# Patient Record
Sex: Female | Born: 1950 | Race: White | Hispanic: No | Marital: Married | Smoking: Never smoker
Health system: Southern US, Community
[De-identification: ages and names within clinical notes are randomized; demographics above are authoritative.]

## PROBLEM LIST (undated history)

## (undated) DIAGNOSIS — M109 Gout, unspecified: Secondary | ICD-10-CM

## (undated) DIAGNOSIS — T7840XA Allergy, unspecified, initial encounter: Secondary | ICD-10-CM

## (undated) DIAGNOSIS — E119 Type 2 diabetes mellitus without complications: Secondary | ICD-10-CM

## (undated) HISTORY — PX: GALLBLADDER SURGERY: SHX652

## (undated) HISTORY — DX: Gout, unspecified: M10.9

## (undated) HISTORY — DX: Allergy, unspecified, initial encounter: T78.40XA

## (undated) HISTORY — DX: Type 2 diabetes mellitus without complications: E11.9

---

## 2003-08-09 ENCOUNTER — Other Ambulatory Visit: Admission: RE | Admit: 2003-08-09 | Discharge: 2003-08-09 | Payer: Self-pay | Admitting: Internal Medicine

## 2004-10-02 ENCOUNTER — Ambulatory Visit: Payer: Self-pay | Admitting: Internal Medicine

## 2004-10-02 ENCOUNTER — Other Ambulatory Visit: Admission: RE | Admit: 2004-10-02 | Discharge: 2004-10-02 | Payer: Self-pay | Admitting: Family Medicine

## 2007-12-10 ENCOUNTER — Ambulatory Visit: Payer: Self-pay | Admitting: Family Medicine

## 2007-12-13 ENCOUNTER — Telehealth (INDEPENDENT_AMBULATORY_CARE_PROVIDER_SITE_OTHER): Payer: Self-pay | Admitting: *Deleted

## 2007-12-21 ENCOUNTER — Encounter (INDEPENDENT_AMBULATORY_CARE_PROVIDER_SITE_OTHER): Payer: Self-pay | Admitting: Internal Medicine

## 2009-02-01 ENCOUNTER — Ambulatory Visit: Payer: Self-pay | Admitting: Family Medicine

## 2013-11-21 ENCOUNTER — Ambulatory Visit: Payer: Self-pay | Admitting: Podiatry

## 2013-11-23 ENCOUNTER — Encounter: Payer: Self-pay | Admitting: Podiatry

## 2013-11-30 ENCOUNTER — Encounter: Payer: Self-pay | Admitting: Podiatry

## 2013-11-30 ENCOUNTER — Ambulatory Visit (INDEPENDENT_AMBULATORY_CARE_PROVIDER_SITE_OTHER): Payer: BC Managed Care – PPO | Admitting: Podiatry

## 2013-11-30 VITALS — BP 168/94 | HR 83 | Resp 16 | Ht 61.0 in | Wt 175.0 lb

## 2013-11-30 DIAGNOSIS — M779 Enthesopathy, unspecified: Secondary | ICD-10-CM

## 2013-11-30 MED ORDER — ALLOPURINOL 100 MG PO TABS: 200.0000 mg | ORAL_TABLET | Freq: Every day | ORAL | Status: DC

## 2013-11-30 NOTE — Progress Notes (Signed)
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 She states that she's been out of her allopurinol and Colcrys for a period of time now. She states that she has not had another attack at this point. She denies fever chills nausea vomiting muscle aches or pains or trauma to the right foot.  Objective: Vital signs are stable she is alert and oriented x3. There is no erythema edema cellulitis drainage or odor. And pulses remain palpable to the right lower extremity.  Assessment: History of gouty capsulitis.  Plan: We will send her out for a uric acid level and we are going to refill her allopurinol. Should her uric acid level come back elevated will notify her immediately.

## 2013-12-01 LAB — URIC ACID: Uric Acid: 9.3 mg/dL — ABNORMAL HIGH (ref 2.5–7.1)

## 2013-12-12 ENCOUNTER — Telehealth: Payer: Self-pay | Admitting: *Deleted

## 2013-12-12 NOTE — Telephone Encounter (Signed)
Spoke with Veronica Mueller letting her know of her labs and to continue allopurinol

## 2013-12-20 ENCOUNTER — Telehealth: Payer: Self-pay | Admitting: *Deleted

## 2013-12-20 MED ORDER — COLCHICINE 0.6 MG PO CAPS: ORAL_CAPSULE | ORAL | Status: DC

## 2013-12-20 NOTE — Telephone Encounter (Signed)
Fax request from [facility] req colcrys 0.6 mg #30 0 refills take one tablet three times a day for 2 days then one tablet daily.

## 2016-02-11 ENCOUNTER — Telehealth: Payer: Self-pay | Admitting: Podiatry

## 2016-02-11 NOTE — Telephone Encounter (Signed)
PT CALLED IN WITH SERVER GOUT PAIN , STATES ITS HARD TO WALK WANTING SEE IF SHE CAN BE SEEN OR IF SHE CAN GET A RX TO HOLD HER UNTIL SHE CAN GET IN TO BE SEEN

## 2016-02-11 NOTE — Telephone Encounter (Signed)
I spoke to pt and told her we would have to see her and transferred to schedulers to be scheduled in [place] with Dr. Jacqualyn Posey as an urgent appt.

## 2016-02-12 ENCOUNTER — Ambulatory Visit (INDEPENDENT_AMBULATORY_CARE_PROVIDER_SITE_OTHER): Payer: Medicare Other

## 2016-02-12 ENCOUNTER — Ambulatory Visit (INDEPENDENT_AMBULATORY_CARE_PROVIDER_SITE_OTHER): Payer: Medicare Other | Admitting: Podiatry

## 2016-02-12 ENCOUNTER — Ambulatory Visit: Payer: Medicare Other

## 2016-02-12 ENCOUNTER — Encounter: Payer: Self-pay | Admitting: Podiatry

## 2016-02-12 VITALS — BP 156/91 | HR 79 | Temp 98.5°F | Resp 18

## 2016-02-12 DIAGNOSIS — M1A4720 Other secondary chronic gout, left ankle and foot, without tophus (tophi): Secondary | ICD-10-CM

## 2016-02-12 DIAGNOSIS — M779 Enthesopathy, unspecified: Secondary | ICD-10-CM | POA: Diagnosis not present

## 2016-02-12 DIAGNOSIS — M10472 Other secondary gout, left ankle and foot: Secondary | ICD-10-CM | POA: Diagnosis not present

## 2016-02-12 DIAGNOSIS — R52 Pain, unspecified: Secondary | ICD-10-CM

## 2016-02-12 MED ORDER — COLCHICINE 0.6 MG PO CAPS: 1.0000 | ORAL_CAPSULE | Freq: Every day | ORAL | Status: DC

## 2016-02-12 NOTE — Progress Notes (Signed)
Patient ID: CORNELIA YEATS, female   DOB: [birth year], 65 y.o.   MRN: [id number]  Subjective: 65 year old female presents the also concerns of pain, swelling, redness or left first big toe joint. She believes that she has gout she's had gout before it feels as seems it did previously. She does have the area does 8 is well around the area of redness. No recent injury or trauma. No open sores. No drainage. She has not been on any gout medicine recently. Denies any systemic complaints such as fevers, chills, nausea, vomiting. No acute changes since last appointment, and no other complaints at this time.   Objective: AAO x3, NAD DP/PT pulses palpable bilaterally, CRT less than 3 seconds Protective sensation intact with Simms Weinstein monofilament There is edema, erythema, increase in warmth the left first MTPJ and there is pain with range of motion and restriction of motion due to pain. There is no ascending cellulitis. There is no open lesion or pre-ulcerative lesions. No drainage. No areas of pinpoint bony tenderness or pain with vibratory sensation. MMT 5/5, ROM WNL except for oherwise stated . No edema, erythema, increase in warmth to bilateral lower extremities.  No open lesions or pre-ulcerative lesions.  No pain with calf compression, swelling, warmth, erythema  Assessment: 65 year old female left first MTPJ capsulitis, likely gout   Plan: -All treatment options discussed with the patient including all alternatives, risks, complications.  -X-rays were obtained and reviewed with the patient. HAV is present. There is no evidence of acute fracture stress fracture. -Discussed steroid injection should proceed with this. Under sterile conditions a mixture of Kenalog and local anesthetic was infiltrated around the first MTPJ without complications. Post injection care was discussed. -Prescribed colchicine. -Diet modifications. -Dispensed surgical shoe as she is unable to wear regular shoe. -Order  blood work. -Follow-up in 2 weeks. Discussed long-term allopurinol. -Patient encouraged to call the office with any questions, concerns, change in symptoms.   Celesta Gentile, DPM

## 2016-02-13 ENCOUNTER — Telehealth: Payer: Self-pay | Admitting: *Deleted

## 2016-02-13 LAB — CBC WITH DIFFERENTIAL/PLATELET
Basophils Absolute: 0.1 10*3/uL (ref 0.0–0.2)
Basos: 1 %
EOS (ABSOLUTE): 0.1 10*3/uL (ref 0.0–0.4)
EOS: 1 %
HEMATOCRIT: 39.8 % (ref 34.0–46.6)
Hemoglobin: 13.7 g/dL (ref 11.1–15.9)
IMMATURE GRANS (ABS): 0 10*3/uL (ref 0.0–0.1)
IMMATURE GRANULOCYTES: 0 %
LYMPHS: 30 %
Lymphocytes Absolute: 3 10*3/uL (ref 0.7–3.1)
MCH: 30.4 pg (ref 26.6–33.0)
MCHC: 34.4 g/dL (ref 31.5–35.7)
MCV: 88 fL (ref 79–97)
MONOCYTES: 12 %
Monocytes Absolute: 1.2 10*3/uL — ABNORMAL HIGH (ref 0.1–0.9)
NEUTROS PCT: 56 %
Neutrophils Absolute: 5.7 10*3/uL (ref 1.4–7.0)
PLATELETS: 268 10*3/uL (ref 150–379)
RBC: 4.5 x10E6/uL (ref 3.77–5.28)
RDW: 13.9 % (ref 12.3–15.4)
WBC: 10 10*3/uL (ref 3.4–10.8)

## 2016-02-13 LAB — C-REACTIVE PROTEIN: CRP: 26.4 mg/L — ABNORMAL HIGH (ref 0.0–4.9)

## 2016-02-13 LAB — BASIC METABOLIC PANEL
BUN/Creatinine Ratio: 15 (ref 11–26)
BUN: 14 mg/dL (ref 8–27)
CALCIUM: 9.3 mg/dL (ref 8.7–10.3)
CO2: 23 mmol/L (ref 18–29)
Chloride: 101 mmol/L (ref 96–106)
Creatinine, Ser: 0.92 mg/dL (ref 0.57–1.00)
GFR calc Af Amer: 76 mL/min/{1.73_m2} (ref >59)
GFR, EST NON AFRICAN AMERICAN: 66 mL/min/{1.73_m2} (ref >59)
GLUCOSE: 151 mg/dL — AB (ref 65–99)
POTASSIUM: 4.5 mmol/L (ref 3.5–5.2)
Sodium: 140 mmol/L (ref 134–144)

## 2016-02-13 LAB — URIC ACID: URIC ACID: 9.9 mg/dL — AB (ref 2.5–7.1)

## 2016-02-13 LAB — SEDIMENTATION RATE: Sed Rate: 16 mm/hr (ref 0–40)

## 2016-02-13 NOTE — Telephone Encounter (Addendum)
-----   Message from Trula Slade, DPM sent at 02/13/2016 12:02 PM EDT ----- Can you let her know that her blood does show gout. Continue with colchicine. Also her blood sugar was high. She needs to see a PCP. Left message for pt to call for results.  02/14/2016-Informed pt of the Dr. Leigh Aurora review of the blood work and his order to continue the colchicine and to go to PCP for elevated glucose.  Pt states she doesn't have PCP, I will mail labs to pt to take to a PCP of her choice.  Pt asked if she would be getting allopurinol, and I told her I did not see it ordered, that he was waiting for the labs to prescribe but would discuss at next appt, because allopurinol may be a long-term medication for her and PCP manage it.  Mailed labs.

## 2016-02-26 ENCOUNTER — Encounter: Payer: Self-pay | Admitting: Podiatry

## 2016-02-26 ENCOUNTER — Ambulatory Visit (INDEPENDENT_AMBULATORY_CARE_PROVIDER_SITE_OTHER): Payer: Medicare Other | Admitting: Podiatry

## 2016-02-26 VITALS — BP 129/67 | HR 78 | Resp 18

## 2016-02-26 DIAGNOSIS — M779 Enthesopathy, unspecified: Secondary | ICD-10-CM | POA: Diagnosis not present

## 2016-02-26 DIAGNOSIS — J301 Allergic rhinitis due to pollen: Secondary | ICD-10-CM | POA: Diagnosis not present

## 2016-02-26 DIAGNOSIS — M1A4720 Other secondary chronic gout, left ankle and foot, without tophus (tophi): Secondary | ICD-10-CM

## 2016-02-26 DIAGNOSIS — M10472 Other secondary gout, left ankle and foot: Secondary | ICD-10-CM

## 2016-02-26 DIAGNOSIS — R7309 Other abnormal glucose: Secondary | ICD-10-CM

## 2016-02-26 MED ORDER — ALLOPURINOL 100 MG PO TABS: 200.0000 mg | ORAL_TABLET | Freq: Every day | ORAL | Status: DC

## 2016-02-26 MED ORDER — COLCHICINE 0.6 MG PO CAPS: 1.0000 | ORAL_CAPSULE | Freq: Every day | ORAL | Status: DC

## 2016-02-26 NOTE — Progress Notes (Signed)
Patient ID: AVIANNE BUCCERI, female   DOB: 10-Oct-1951, 65 y.o.   MRN: [id number]  Subjective: 65 year-old female presents the office today for follow-up evaluation of gout and capsulitis left big toe. She states that she was continued with the colchicine initially injection she had improvement in symptoms however surrounding it worse. On the last day or 2. She also states that she is on her feet quite a bit. No recent injury or trauma. She states that she can still not where shoe due to the pain. She states is red overlying the bunion site. No recent injury or trauma. Denies any systemic complaints such as fevers, chills, nausea, vomiting. No acute changes since last appointment, and no other complaints at this time.   Objective: AAO x3, NAD DP/PT pulses palpable bilaterally, CRT less than 3 seconds Protective sensation intact with Simms Weinstein monofilament There is continued edema as well as erythema along the medial aspect of the first metatarsal head on the left foot. There is no There is no pain with first MTPJ range of motion. There is no ascneding cellulitis. There are no open lesions or pre-ulcerative lesions identified at this time. No areas of pinpoint bony tenderness or pain with vibratory sensation. MMT 5/5, ROM WNL. No edema, erythema, increase in warmth to bilateral lower extremities.  No pain with calf compression, swelling, warmth, erythema  Assessment: Capsulitis/gout left first MTPJ  Plan: -All treatment options discussed with the patient including all alternatives, risks, complications.  -Bloodwork is reviewed. Significantly elevated uric acid and  inflammatory markers. Also increased glucose. Will order A1c. She doesn't have a primary care physician who recommended her to follow-up with one. -Discussed repeat steroid injection over the area of maximal tenderness the first metatarsal head. She lacks pursue this. Under sterile conditions a mixture Kenalog as well as local and  a static was infiltrated without, occasions. Post injection care was discussed. -Will continue colchicine for 1 more week then start allopurinol. Both were ordered today. -Patient encouraged to call the office with any questions, concerns, change in symptoms.   Celesta Gentile, DPM

## 2016-02-27 LAB — HEMOGLOBIN A1C
ESTIMATED AVERAGE GLUCOSE: 157 mg/dL
HEMOGLOBIN A1C: 7.1 % — AB (ref 4.8–5.6)

## 2016-02-28 ENCOUNTER — Telehealth: Payer: Self-pay | Admitting: *Deleted

## 2016-02-28 NOTE — Telephone Encounter (Addendum)
-----   Message from Trula Slade, DPM sent at 02/27/2016  4:53 PM EDT ----- Can you let her know that her A1c is elevated. She needs to find a PCP and follow-up. Informed Veronica Mueller of Dr. Leigh Aurora recommendation and mailed labs to Veronica Mueller.

## 2016-03-14 ENCOUNTER — Ambulatory Visit: Payer: Self-pay | Admitting: Primary Care

## 2016-03-18 ENCOUNTER — Ambulatory Visit: Payer: Medicare Other | Admitting: Podiatry

## 2016-03-31 DIAGNOSIS — Z1159 Encounter for screening for other viral diseases: Secondary | ICD-10-CM | POA: Diagnosis not present

## 2016-03-31 DIAGNOSIS — E119 Type 2 diabetes mellitus without complications: Secondary | ICD-10-CM | POA: Diagnosis not present

## 2016-04-04 ENCOUNTER — Ambulatory Visit: Payer: Self-pay | Admitting: *Deleted

## 2016-04-07 ENCOUNTER — Encounter: Payer: Medicare Other | Attending: Family Medicine | Admitting: *Deleted

## 2016-04-07 ENCOUNTER — Encounter: Payer: Self-pay | Admitting: *Deleted

## 2016-04-07 VITALS — BP 140/84 | Ht 61.0 in | Wt 183.7 lb

## 2016-04-07 DIAGNOSIS — E119 Type 2 diabetes mellitus without complications: Secondary | ICD-10-CM | POA: Diagnosis not present

## 2016-04-07 NOTE — Patient Instructions (Addendum)
Check blood sugars 2 x day before breakfast and 2 hrs after supper - 3 to 4 x week Exercise: Begin walking for 10-15 minutes  3 days a week as tolerated Eat 3 meals day,  2  snacks a day Space meals 4-6 hours apart Avoid sugar sweetened drinks (soda, tea, juices) Limit foods high in fat Limit desserts/sweets Bring blood sugar records to the next class Call your doctor for a prescription for:  1. Meter strips (type) One Touch Verio checking 3-4 times per week  2. Lancets (type) One Touch Delica checking 3-4   times per week

## 2016-04-07 NOTE — Progress Notes (Signed)
Diabetes Self-Management Education  Visit Type: First/Initial  Appt. Start Time: 0915 Appt. End Time: 1030  04/07/2016  Veronica Mueller, identified by name and date of birth, is a 65 y.o. female with a diagnosis of Diabetes: Type 2.   ASSESSMENT  Blood pressure 140/84, height 5\' 1"  (1.549 m), weight 183 lb 11.2 oz (83.326 kg). Body mass index is 34.73 kg/(m^2).      Diabetes Self-Management Education - 04/07/16 1036    Visit Information   Visit Type First/Initial   Initial Visit   Diabetes Type Type 2   Are you currently following a meal plan? No   Are you taking your medications as prescribed? Yes   Date Diagnosed April 2017   Health Coping   How would you rate your overall health? Good   Psychosocial Assessment   Patient Belief/Attitude about Diabetes Afraid   Self-care barriers None   Self-management support Doctor's office   Patient Concerns Nutrition/Meal planning;Monitoring;Glycemic Control;Healthy Lifestyle;Weight Control;Problem Solving   Special Needs None   Preferred Learning Style Hands on;Auditory   Learning Readiness Contemplating   How often do you need to have someone help you when you read instructions, pamphlets, or other written materials from your doctor or pharmacy? 1 - Never   What is the last grade level you completed in school? college   Pre-Education Assessment   Patient understands the diabetes disease and treatment process. Needs Instruction   Patient understands incorporating nutritional management into lifestyle. Needs Instruction   Patient undertands incorporating physical activity into lifestyle. Needs Instruction   Patient understands using medications safely. Needs Instruction   Patient understands monitoring blood glucose, interpreting and using results Needs Instruction   Patient understands prevention, detection, and treatment of acute complications. Needs Instruction   Patient understands prevention, detection, and treatment of  chronic complications. Needs Instruction   Patient understands how to develop strategies to address psychosocial issues. Needs Instruction   Patient understands how to develop strategies to promote health/change behavior. Needs Instruction   Complications   Last HgB A1C per patient/outside source 7.1 %  02/26/16   How often do you check your blood sugar? 0 times/day (not testing)  Provided One Touch Verio Flex meter and instructed on use. BG upon return demonstration was 142 mg/dL at 10:15 am - fasting.    Have you had a dilated eye exam in the past 12 months? Yes   Have you had a dental exam in the past 12 months? No   Are you checking your feet? No   Dietary Intake   Breakfast bacon, eggs and toast; cereal with whole milk and banana   Lunch sandwich or chesse crackers with apple   Snack (afternoon) cookie or something sweet   Dinner chicken or roast or pork (baked, fried or grilled) with potatoes, beans, corn, salad, cuccumbers, beets   Snack (evening) cookie or something sweet   Beverage(s) water, soda, sugar sweetened tea   Exercise   Exercise Type ADL's   Patient Education   Previous Diabetes Education No   Disease state  Definition of diabetes, type 1 and 2, and the diagnosis of diabetes;Factors that contribute to the development of diabetes   Nutrition management  Role of diet in the treatment of diabetes and the relationship between the three main macronutrients and blood glucose level   Physical activity and exercise  Role of exercise on diabetes management, blood pressure control and cardiac health.   Monitoring Taught/evaluated SMBG meter.;Purpose and frequency of SMBG.;Identified appropriate SMBG  and/or A1C goals.   Chronic complications Relationship between chronic complications and blood glucose control   Psychosocial adjustment Identified and addressed patients feelings and concerns about diabetes   Individualized Goals (developed by patient)   Reducing Risk Improve blood  sugars Prevent diabetes complications Lose weight Lead a healthier lifestyle Become more fit   Outcomes   Expected Outcomes Demonstrated interest in learning. Expect positive outcomes   Future DMSE 2 wks      Individualized Plan for Diabetes Self-Management Training:   Learning Objective:  Patient will have a greater understanding of diabetes self-management. Patient education plan is to attend individual and/or group sessions per assessed needs and concerns.   Plan:   Patient Instructions  Check blood sugars 2 x day before breakfast and 2 hrs after supper - 3 to 4 x week Exercise: Begin walking for 10-15 minutes  3 days a week as tolerated Eat 3 meals day,  2  snacks a day Space meals 4-6 hours apart Avoid sugar sweetened drinks (soda, tea, juices) Limit foods high in fat Limit desserts/sweets Bring blood sugar records to the next class Call your doctor for a prescription for:  1. Meter strips (type) One Touch Verio checking 3-4 times per week  2. Lancets (type) One Touch Delica checking 3-4   times per week   Expected Outcomes:  Demonstrated interest in learning. Expect positive outcomes  Education material provided:  General Meal Planning Guidelines Simple Meal Plan Meter - One Touch Verio Flex  If problems or questions, patient to contact team via:   Johny Drilling, [place], [place], CDE [phone]  Future DSME appointment: 3 wks  April 28, 2016 for Diabetes Class 1

## 2016-04-28 ENCOUNTER — Encounter: Payer: Medicare Other | Attending: Family Medicine | Admitting: Dietician

## 2016-04-28 ENCOUNTER — Encounter: Payer: Self-pay | Admitting: Dietician

## 2016-04-28 VITALS — Ht 61.0 in | Wt 185.8 lb

## 2016-04-28 DIAGNOSIS — E119 Type 2 diabetes mellitus without complications: Secondary | ICD-10-CM | POA: Diagnosis not present

## 2016-04-28 NOTE — Progress Notes (Signed)

## 2016-05-05 ENCOUNTER — Encounter: Payer: Medicare Other | Admitting: *Deleted

## 2016-05-05 VITALS — Wt 181.5 lb

## 2016-05-05 DIAGNOSIS — E119 Type 2 diabetes mellitus without complications: Secondary | ICD-10-CM | POA: Diagnosis not present

## 2016-05-06 ENCOUNTER — Encounter: Payer: Self-pay | Admitting: *Deleted

## 2016-05-06 NOTE — Progress Notes (Signed)

## 2016-05-12 ENCOUNTER — Encounter: Payer: Medicare Other | Admitting: Dietician

## 2016-05-12 VITALS — BP 140/96 | Wt 179.7 lb

## 2016-05-12 DIAGNOSIS — E119 Type 2 diabetes mellitus without complications: Secondary | ICD-10-CM | POA: Diagnosis not present

## 2016-05-12 NOTE — Progress Notes (Signed)

## 2016-05-30 ENCOUNTER — Encounter: Payer: Self-pay | Admitting: *Deleted

## 2016-06-24 DIAGNOSIS — Z1159 Encounter for screening for other viral diseases: Secondary | ICD-10-CM | POA: Diagnosis not present

## 2016-06-24 DIAGNOSIS — E119 Type 2 diabetes mellitus without complications: Secondary | ICD-10-CM | POA: Diagnosis not present

## 2016-07-01 DIAGNOSIS — Z1151 Encounter for screening for human papillomavirus (HPV): Secondary | ICD-10-CM | POA: Diagnosis not present

## 2016-07-01 DIAGNOSIS — Z780 Asymptomatic menopausal state: Secondary | ICD-10-CM | POA: Diagnosis not present

## 2016-07-01 DIAGNOSIS — Z1231 Encounter for screening mammogram for malignant neoplasm of breast: Secondary | ICD-10-CM | POA: Diagnosis not present

## 2016-07-01 DIAGNOSIS — Z23 Encounter for immunization: Secondary | ICD-10-CM | POA: Diagnosis not present

## 2016-07-01 DIAGNOSIS — Z01419 Encounter for gynecological examination (general) (routine) without abnormal findings: Secondary | ICD-10-CM | POA: Diagnosis not present

## 2016-07-01 DIAGNOSIS — E119 Type 2 diabetes mellitus without complications: Secondary | ICD-10-CM | POA: Diagnosis not present

## 2016-07-01 DIAGNOSIS — E789 Disorder of lipoprotein metabolism, unspecified: Secondary | ICD-10-CM | POA: Diagnosis not present

## 2016-07-01 DIAGNOSIS — Z1211 Encounter for screening for malignant neoplasm of colon: Secondary | ICD-10-CM | POA: Diagnosis not present

## 2016-07-01 DIAGNOSIS — Z124 Encounter for screening for malignant neoplasm of cervix: Secondary | ICD-10-CM | POA: Diagnosis not present

## 2016-08-29 DIAGNOSIS — D2271 Melanocytic nevi of right lower limb, including hip: Secondary | ICD-10-CM | POA: Diagnosis not present

## 2016-08-29 DIAGNOSIS — D225 Melanocytic nevi of trunk: Secondary | ICD-10-CM | POA: Diagnosis not present

## 2016-08-29 DIAGNOSIS — X32XXXA Exposure to sunlight, initial encounter: Secondary | ICD-10-CM | POA: Diagnosis not present

## 2016-08-29 DIAGNOSIS — B078 Other viral warts: Secondary | ICD-10-CM | POA: Diagnosis not present

## 2016-08-29 DIAGNOSIS — L0889 Other specified local infections of the skin and subcutaneous tissue: Secondary | ICD-10-CM | POA: Diagnosis not present

## 2016-08-29 DIAGNOSIS — D2272 Melanocytic nevi of left lower limb, including hip: Secondary | ICD-10-CM | POA: Diagnosis not present

## 2016-08-29 DIAGNOSIS — L570 Actinic keratosis: Secondary | ICD-10-CM | POA: Diagnosis not present

## 2016-08-29 DIAGNOSIS — D2262 Melanocytic nevi of left upper limb, including shoulder: Secondary | ICD-10-CM | POA: Diagnosis not present

## 2017-11-19 ENCOUNTER — Other Ambulatory Visit: Payer: Self-pay

## 2017-11-19 ENCOUNTER — Ambulatory Visit (INDEPENDENT_AMBULATORY_CARE_PROVIDER_SITE_OTHER): Payer: Medicare HMO

## 2017-11-19 ENCOUNTER — Ambulatory Visit
Admission: EM | Admit: 2017-11-19 | Discharge: 2017-11-19 | Disposition: A | Discharge status: Home / Self Care | Payer: Medicare HMO | Attending: Family Medicine | Admitting: Family Medicine

## 2017-11-19 DIAGNOSIS — J029 Acute pharyngitis, unspecified: Secondary | ICD-10-CM | POA: Diagnosis not present

## 2017-11-19 DIAGNOSIS — R05 Cough: Secondary | ICD-10-CM | POA: Diagnosis not present

## 2017-11-19 DIAGNOSIS — J069 Acute upper respiratory infection, unspecified: Secondary | ICD-10-CM | POA: Diagnosis not present

## 2017-11-19 MED ORDER — HYDROCOD POLST-CPM POLST ER 10-8 MG/5ML PO SUER: 2.5000 mL | Freq: Two times a day (BID) | ORAL | 0 refills | Status: DC

## 2017-11-19 MED ORDER — AZITHROMYCIN 250 MG PO TABS: 250.0000 mg | ORAL_TABLET | Freq: Every day | ORAL | 0 refills | Status: DC

## 2017-11-19 MED ORDER — ALBUTEROL SULFATE HFA 108 (90 BASE) MCG/ACT IN AERS: 1.0000 | INHALATION_SPRAY | Freq: Four times a day (QID) | RESPIRATORY_TRACT | 0 refills | Status: AC | PRN

## 2017-11-19 NOTE — ED Provider Notes (Signed)
[facility]    CSN: [id number] Arrival date & time: 11/19/17  1605     History   Chief Complaint Chief Complaint  Patient presents with  . Cough    HPI Veronica Mueller is a 66 y.o. female.   HPI  Is a 66 year old female who states that she has been sick since Thanksgiving with a productive cough of green sputum nasal drainage ear pressure was nasal drip.  She has seen primary but they have never given her any antibiotics over this month and a half.  Been given Tessalon Perles prednisone and has been taking over-the-counter cough preparations such as Mucinex and Delsym.  She has even vomited green sputum she was gagged with the coughing.  She states that she is not sleeping at nights because of the coughing.  Is a previous history of bronchial pneumonia.         Past Medical History:  Diagnosis Date  . Allergy   . Diabetes mellitus without complication ([place])   . Gout     There are no active problems to display for this patient.   Past Surgical History:  Procedure Laterality Date  . GALLBLADDER SURGERY      OB History    No data available       Home Medications    Prior to Admission medications   Medication Sig Start Date End Date Taking? Authorizing Provider  albuterol (PROVENTIL HFA;VENTOLIN HFA) 108 (90 Base) MCG/ACT inhaler Inhale 1-2 puffs into the lungs every 6 (six) hours as needed for wheezing or shortness of breath. Use with spacer 11/19/17   Lorin Picket, PA-C  azithromycin (ZITHROMAX) 250 MG tablet Take 1 tablet (250 mg total) by mouth daily. Take first 2 tablets together, then 1 every day until finished. 11/19/17   Lorin Picket, PA-C  chlorpheniramine-HYDROcodone (TUSSIONEX PENNKINETIC ER) 10-8 MG/5ML SUER Take 2.5 mLs by mouth 2 (two) times daily. 11/19/17   Lorin Picket, PA-C    Family History Family History  Problem Relation Age of Onset  . Diabetes Paternal Uncle   . COPD Mother   . Heart attack Father      Social History Social History   Tobacco Use  . Smoking status: Never Smoker  . Smokeless tobacco: Never Used  Substance Use Topics  . Alcohol use: No    Alcohol/week: 0.0 oz  . Drug use: No     Allergies   Patient has no known allergies.   Review of Systems Review of Systems  Constitutional: Positive for activity change, chills, fatigue and fever.  HENT: Positive for congestion, ear pain, postnasal drip, rhinorrhea, sinus pressure and sore throat.   Respiratory: Positive for cough.   Gastrointestinal: Positive for nausea and vomiting.  All other systems reviewed and are negative.    Physical Exam Triage Vital Signs ED Triage Vitals  Enc Vitals Group     BP 11/19/17 1620 (!) 149/55     Pulse Rate 11/19/17 1620 65     Resp 11/19/17 1620 16     Temp 11/19/17 1620 98.6 F (37 C)     Temp Source 11/19/17 1620 Oral     SpO2 11/19/17 1620 99 %     Weight 11/19/17 1616 168 lb (76.2 kg)     Height 11/19/17 1616 5\' 1"  (1.549 m)     Head Circumference --      Peak Flow --      Pain Score 11/19/17 1616 6     Pain  Loc --      Pain Edu? --      Excl. in [place]? --    No data found.  Updated Vital Signs BP (!) 149/55 (BP Location: Left Arm)   Pulse 65   Temp 98.6 F (37 C) (Oral)   Resp 16   Ht 5\' 1"  (1.549 m)   Wt 168 lb (76.2 kg)   SpO2 99%   BMI 31.74 kg/m   Visual Acuity Right Eye Distance:   Left Eye Distance:   Bilateral Distance:    Right Eye Near:   Left Eye Near:    Bilateral Near:     Physical Exam  Constitutional: She is oriented to person, place, and time. She appears well-developed and well-nourished. No distress.  HENT:  Head: Normocephalic.  Right Ear: External ear normal.  Left Ear: External ear normal.  Nose: Nose normal.  Mouth/Throat: Oropharynx is clear and moist. No oropharyngeal exudate.  Eyes: Pupils are equal, round, and reactive to light. Right eye exhibits no discharge. Left eye exhibits no discharge.  Neck: Normal range of  motion.  Pulmonary/Chest: Effort normal and breath sounds normal.  Musculoskeletal: Normal range of motion.  Lymphadenopathy:    She has cervical adenopathy.  Neurological: She is alert and oriented to person, place, and time.  Skin: Skin is warm and dry. She is not diaphoretic.  Psychiatric: She has a normal mood and affect. Her behavior is normal. Judgment and thought content normal.  Nursing note and vitals reviewed.    UC Treatments / Results  Labs (all labs ordered are listed, but only abnormal results are displayed) Labs Reviewed - No data to display  EKG  EKG Interpretation None       Radiology Dg Chest 2 View  Result Date: 11/19/2017 CLINICAL DATA:  Cough EXAM: CHEST  2 VIEW COMPARISON:  None. FINDINGS: The heart size and mediastinal contours are within normal limits. Both lungs are clear. Degenerative changes of the spine. IMPRESSION: No active cardiopulmonary disease. Electronically Signed   By: Donavan Foil M.D.   On: 11/19/2017 17:14    Procedures Procedures (including critical care time)  Medications Ordered in UC Medications - No data to display   Initial Impression / Assessment and Plan / UC Course  I have reviewed the triage vital signs and the nursing notes.  Pertinent labs & imaging results that were available during my care of the patient were reviewed by me and considered in my medical decision making (see chart for details).     Plan: 1. Test/x-ray results and diagnosis reviewed with patient 2. rx as per orders; risks, benefits, potential side effects reviewed with patient 3. Recommend supportive treatment with fluids and rest.  Because of the length of time that she has had the symptoms despite a negative chest x-ray we will place her on a antibiotic to see if this will help stop her symptoms.  Also provide her with Tussionex for nighttime so she will be able to get some rest.  She is cautioned not to drive and not to take the medication as well  requiring concentration or judgment activities.  He is not improving I recommend she follow-up with her primary care physician 4. F/u prn if symptoms worsen or don't improve   Final Clinical Impressions(s) / UC Diagnoses   Final diagnoses:  Upper respiratory tract infection, unspecified type    ED Discharge Orders        Ordered    chlorpheniramine-HYDROcodone (TUSSIONEX PENNKINETIC ER) 10-8  MG/5ML SUER  2 times daily     11/19/17 1736    albuterol (PROVENTIL HFA;VENTOLIN HFA) 108 (90 Base) MCG/ACT inhaler  Every 6 hours PRN    Comments:  Provide spacer and instructions to patient   11/19/17 1736    azithromycin (ZITHROMAX) 250 MG tablet  Daily     11/19/17 1736       Controlled Substance Prescriptions [place] Controlled Substance Registry consulted? Not Applicable   Lorin Picket, PA-C 11/19/17 1742

## 2017-11-19 NOTE — ED Triage Notes (Signed)
Pt reports she has been sick since Thanksgiving. Seen by her PCP on Dec 14th and given steroids. Has had productive cough, nasal drainage, pressure in ears and drainage down back of throat.

## 2018-07-23 DIAGNOSIS — L538 Other specified erythematous conditions: Secondary | ICD-10-CM | POA: Diagnosis not present

## 2018-07-23 DIAGNOSIS — L720 Epidermal cyst: Secondary | ICD-10-CM | POA: Diagnosis not present

## 2018-07-23 DIAGNOSIS — L298 Other pruritus: Secondary | ICD-10-CM | POA: Diagnosis not present

## 2018-07-23 DIAGNOSIS — H5340 Unspecified visual field defects: Secondary | ICD-10-CM | POA: Diagnosis not present

## 2018-07-23 DIAGNOSIS — L918 Other hypertrophic disorders of the skin: Secondary | ICD-10-CM | POA: Diagnosis not present

## 2018-07-23 DIAGNOSIS — L820 Inflamed seborrheic keratosis: Secondary | ICD-10-CM | POA: Diagnosis not present

## 2018-08-30 DIAGNOSIS — D2262 Melanocytic nevi of left upper limb, including shoulder: Secondary | ICD-10-CM | POA: Diagnosis not present

## 2018-08-30 DIAGNOSIS — L538 Other specified erythematous conditions: Secondary | ICD-10-CM | POA: Diagnosis not present

## 2018-08-30 DIAGNOSIS — L821 Other seborrheic keratosis: Secondary | ICD-10-CM | POA: Diagnosis not present

## 2018-08-30 DIAGNOSIS — D2271 Melanocytic nevi of right lower limb, including hip: Secondary | ICD-10-CM | POA: Diagnosis not present

## 2018-08-30 DIAGNOSIS — B078 Other viral warts: Secondary | ICD-10-CM | POA: Diagnosis not present

## 2018-08-30 DIAGNOSIS — D2272 Melanocytic nevi of left lower limb, including hip: Secondary | ICD-10-CM | POA: Diagnosis not present

## 2018-08-30 DIAGNOSIS — R238 Other skin changes: Secondary | ICD-10-CM | POA: Diagnosis not present

## 2018-08-30 DIAGNOSIS — D2261 Melanocytic nevi of right upper limb, including shoulder: Secondary | ICD-10-CM | POA: Diagnosis not present

## 2019-06-08 IMAGING — CR DG CHEST 2V
2 series · 2 of 2 positions shown · non-contrast
Comparison: None.

CLINICAL DATA: Cough

EXAM:
CHEST  2 VIEW

[chest pa]
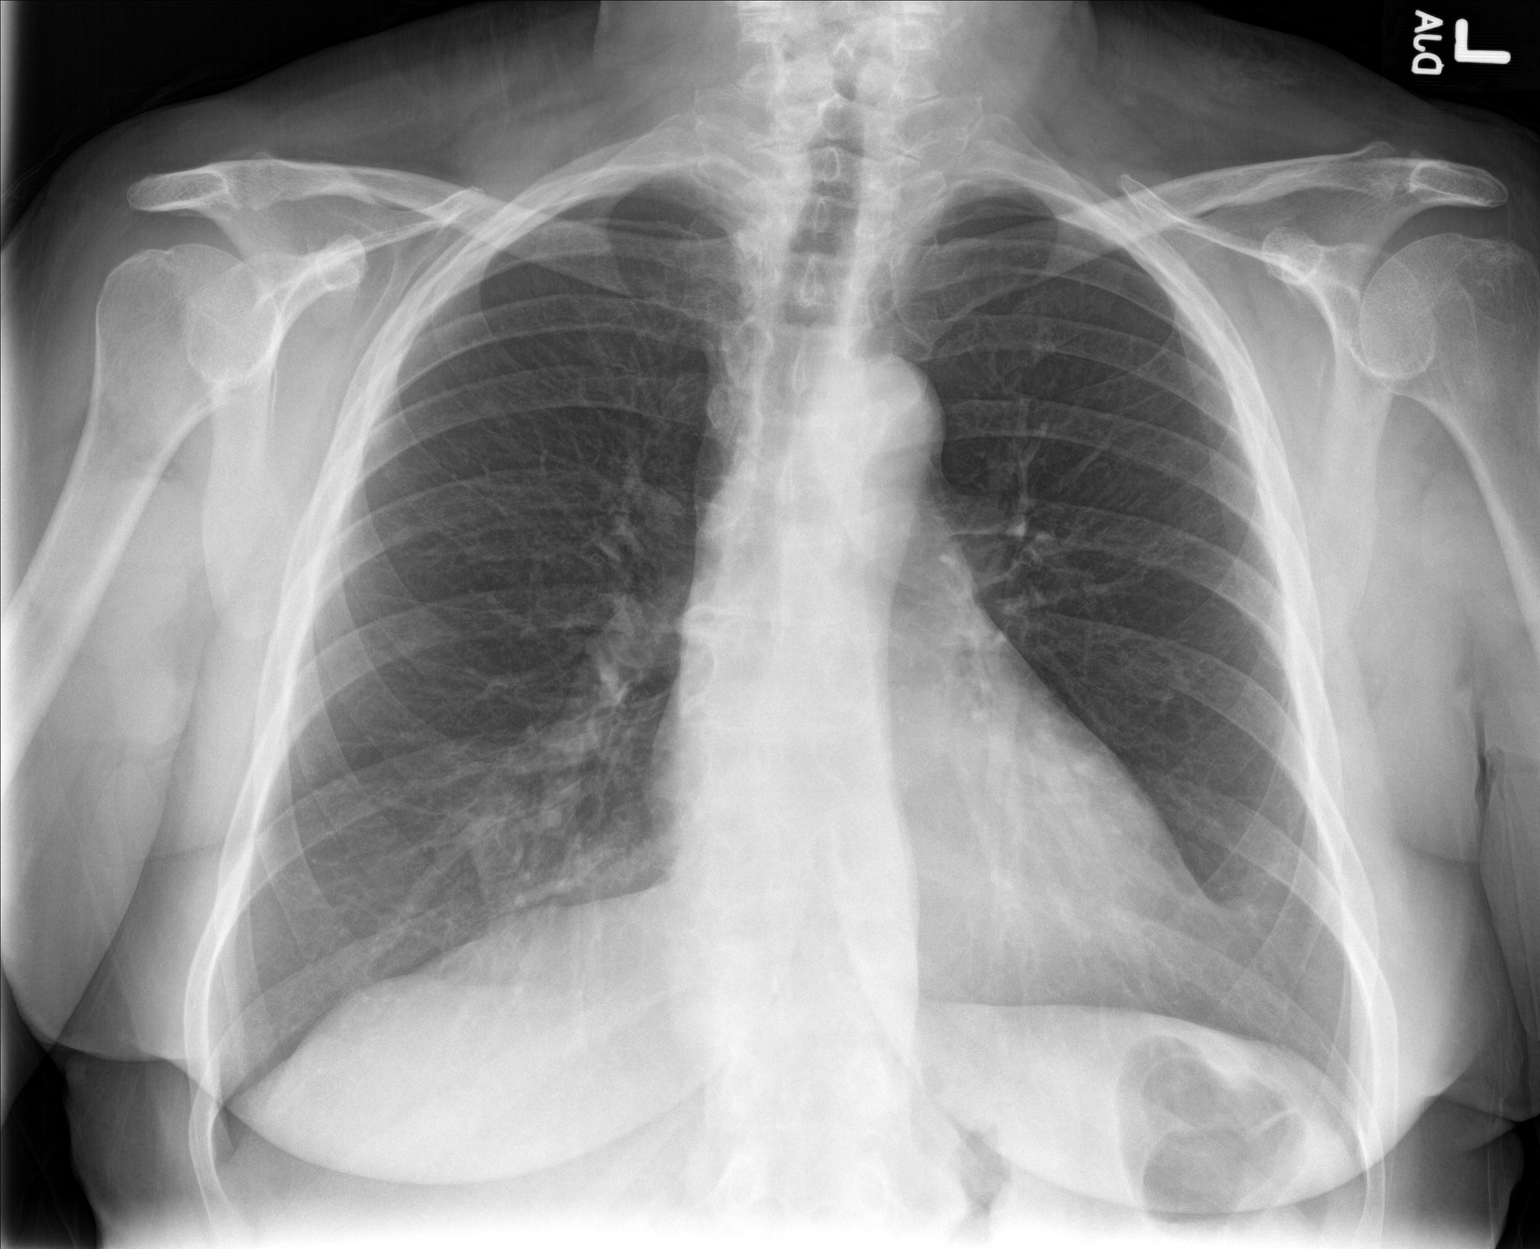

[chest lat]
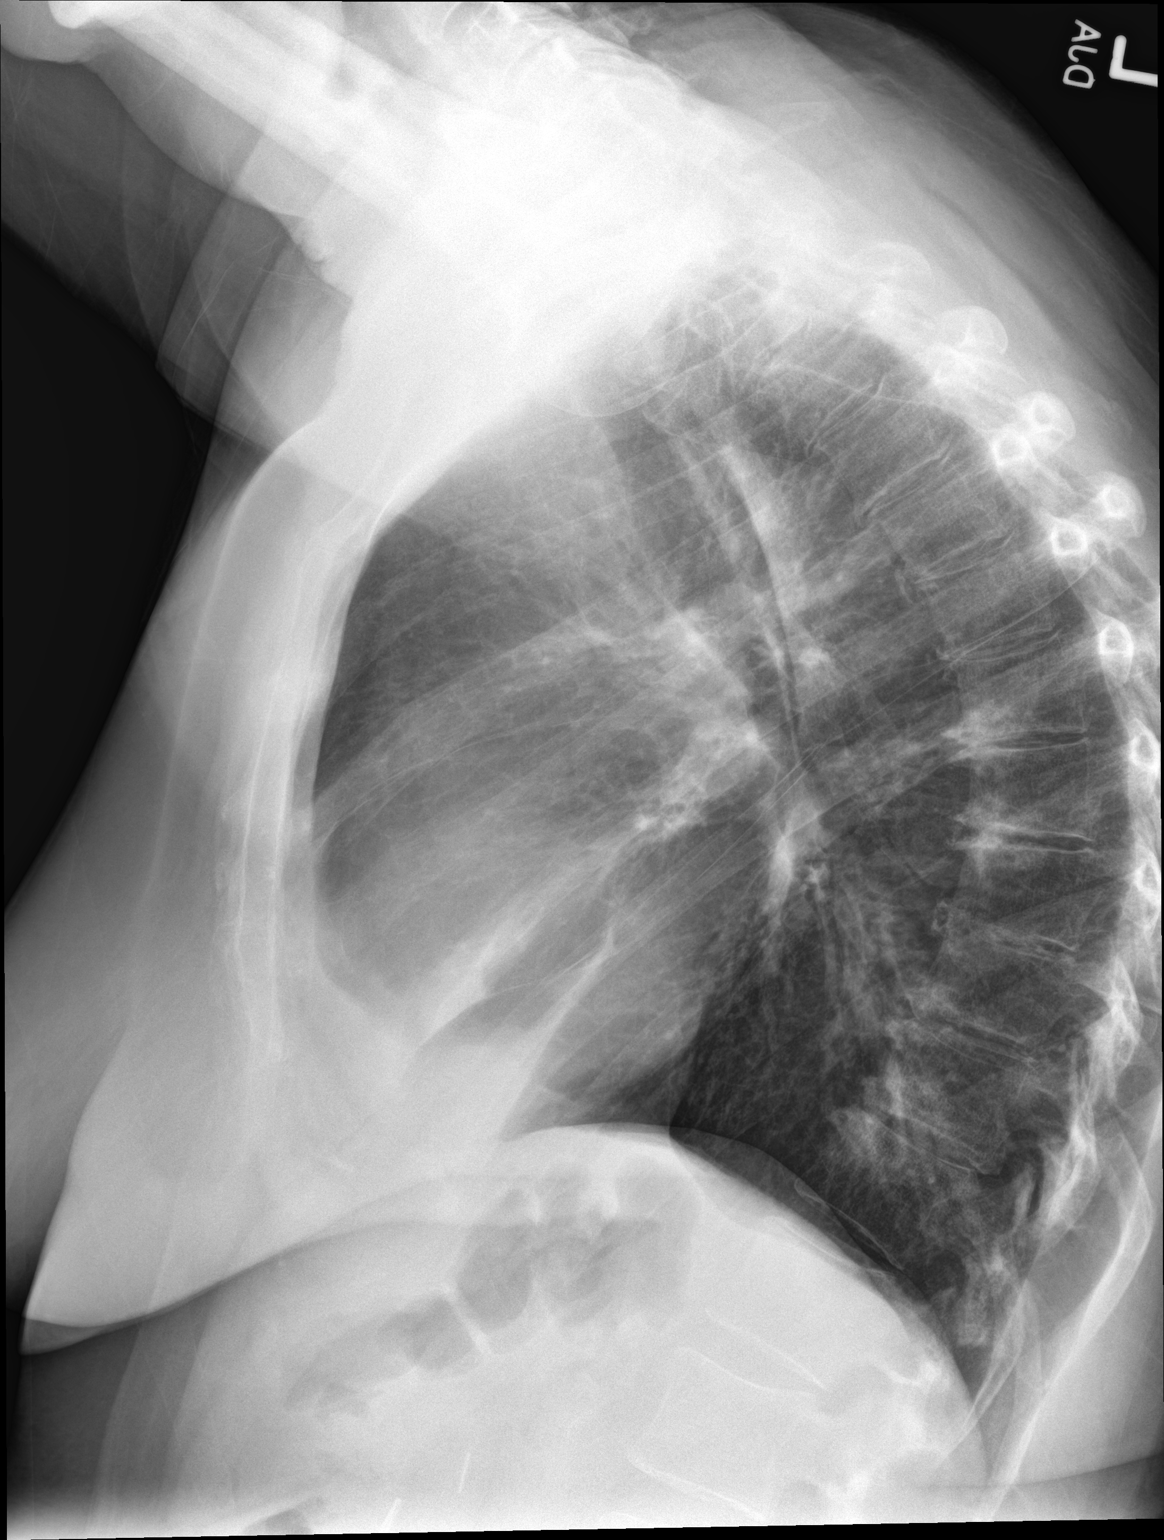

[2 of 2 positions shown; findings below may reference images not displayed]

FINDINGS: The heart size and mediastinal contours are within normal limits.
Both lungs are clear. Degenerative changes of the spine.
IMPRESSION: No active cardiopulmonary disease.

## 2020-01-26 ENCOUNTER — Ambulatory Visit: Payer: Medicare HMO | Attending: Internal Medicine

## 2020-01-26 ENCOUNTER — Other Ambulatory Visit: Payer: Self-pay

## 2020-01-26 DIAGNOSIS — Z23 Encounter for immunization: Secondary | ICD-10-CM

## 2020-01-26 NOTE — Progress Notes (Signed)
   Covid-19 Vaccination Clinic  Name:  Veronica Mueller    MRN: [id number] DOB: [birth year]  01/26/2020  [name] was observed post Covid-19 immunization for 15 minutes without incident. She was provided with Vaccine Information Sheet and instruction to access the V-Safe system.   [name] was instructed to call 911 with any severe reactions post vaccine: Marland Kitchen Difficulty breathing  . Swelling of face and throat  . A fast heartbeat  . A bad rash all over body  . Dizziness and weakness   Immunizations Administered    Name Date Dose VIS Date Route   Pfizer COVID-19 Vaccine 01/26/2020  8:43 AM 0.3 mL 11/04/2019 Intramuscular   Manufacturer: [place]   Lot: [id number]   [place]: [id number]

## 2020-02-18 ENCOUNTER — Ambulatory Visit: Payer: Medicare HMO

## 2020-02-21 ENCOUNTER — Ambulatory Visit: Payer: Medicare HMO | Attending: Internal Medicine

## 2020-02-21 DIAGNOSIS — Z23 Encounter for immunization: Secondary | ICD-10-CM

## 2020-02-21 NOTE — Progress Notes (Signed)
   Covid-19 Vaccination Clinic  Name:  Veronica Mueller    MRN: [id number] DOB: October 09, 1951  02/21/2020  [name] was observed post Covid-19 immunization for 15 minutes without incident. She was provided with Vaccine Information Sheet and instruction to access the V-Safe system.   [name] was instructed to call 911 with any severe reactions post vaccine: Marland Kitchen Difficulty breathing  . Swelling of face and throat  . A fast heartbeat  . A bad rash all over body  . Dizziness and weakness   Immunizations Administered    Name Date Dose VIS Date Route   Pfizer COVID-19 Vaccine 02/21/2020 10:00 AM 0.3 mL 11/04/2019 Intramuscular   Manufacturer: [place]   Lot: [id number]   [place]: [id number]

## 2023-06-26 ENCOUNTER — Ambulatory Visit
Admission: EM | Admit: 2023-06-26 | Discharge: 2023-06-26 | Disposition: A | Discharge status: Home / Self Care | Payer: Medicare HMO | Source: Home / Self Care

## 2023-06-26 ENCOUNTER — Encounter: Payer: Self-pay | Admitting: Emergency Medicine

## 2023-06-26 DIAGNOSIS — J029 Acute pharyngitis, unspecified: Secondary | ICD-10-CM

## 2023-06-26 DIAGNOSIS — U071 COVID-19: Secondary | ICD-10-CM

## 2023-06-26 DIAGNOSIS — R051 Acute cough: Secondary | ICD-10-CM | POA: Diagnosis not present

## 2023-06-26 LAB — GROUP A STREP BY PCR: Group A Strep by PCR: NOT DETECTED

## 2023-06-26 LAB — SARS CORONAVIRUS 2 BY RT PCR: SARS Coronavirus 2 by RT PCR: POSITIVE — AB

## 2023-06-26 MED ORDER — NIRMATRELVIR/RITONAVIR (PAXLOVID) TABLET (RENAL DOSING): 2.0000 | ORAL_TABLET | Freq: Two times a day (BID) | ORAL | 0 refills | Status: AC

## 2023-06-26 MED ORDER — NIRMATRELVIR/RITONAVIR (PAXLOVID) TABLET (RENAL DOSING): 2.0000 | ORAL_TABLET | Freq: Two times a day (BID) | ORAL | 0 refills | Status: DC

## 2023-06-26 MED ORDER — BENZONATATE 200 MG PO CAPS: 200.0000 mg | ORAL_CAPSULE | Freq: Three times a day (TID) | ORAL | 0 refills | Status: AC | PRN

## 2023-06-26 MED ORDER — LIDOCAINE VISCOUS HCL 2 % MT SOLN: 15.0000 mL | OROMUCOSAL | 0 refills | Status: AC | PRN

## 2023-06-26 NOTE — ED Provider Notes (Signed)
[facility]    CSN: [id number] Arrival date & time: 06/26/23  0851      History   Chief Complaint Chief Complaint  Patient presents with   Cough   Sore Throat    HPI Veronica Mueller is a 72 y.o. female presenting for fever, chills, sore throat, cough, congestion that began yesterday.  Also reports fatigue.  States she has been unknowingly around others with COVID.  Has not taken any medicine over-the-counter for symptoms.  Did not take her blood pressure medication this morning because he says her throat hurts too much to swallow.  Current BP is 179/73.  Other medical history significant for diabetes and gout.  HPI  Past Medical History:  Diagnosis Date   Allergy    Diabetes mellitus without complication (HCC)    Gout     There are no problems to display for this patient.   Past Surgical History:  Procedure Laterality Date   GALLBLADDER SURGERY      OB History   No obstetric history on file.      Home Medications    Prior to Admission medications   Medication Sig Start Date End Date Taking? Authorizing Provider  allopurinol (ZYLOPRIM) 100 MG tablet Take by mouth. 08/21/22 08/16/23 Yes [provider]  benzonatate (TESSALON) 200 MG capsule Take 1 capsule (200 mg total) by mouth 3 (three) times daily as needed for cough. 06/26/23  Yes Eusebio Friendly B, PA-C  escitalopram (LEXAPRO) 10 MG tablet Take 1 tablet by mouth daily. 01/13/23 01/13/24 Yes [provider]  hydrochlorothiazide (HYDRODIURIL) 12.5 MG tablet Take 12.5 mg by mouth. 04/29/23 04/28/24 Yes [provider]  JANUVIA 25 MG tablet Take 1 tablet by mouth daily. 06/15/23 06/14/24 Yes [provider]  lidocaine (XYLOCAINE) 2 % solution Use as directed 15 mLs in the mouth or throat every 3 (three) hours as needed for mouth pain (swish and spit). 06/26/23  Yes Shirlee Latch, PA-C  losartan (COZAAR) 100 MG tablet Take 100 mg by mouth. 06/15/23 06/14/24 Yes [provider]   metFORMIN (GLUCOPHAGE-XR) 500 MG 24 hr tablet Take 1 tablet by mouth 2 (two) times daily. 10/25/20 05/03/24 Yes [provider]  pravastatin (PRAVACHOL) 10 MG tablet Take 1 tablet by mouth at bedtime. 01/13/23 01/13/24 Yes [provider]  albuterol (PROVENTIL HFA;VENTOLIN HFA) 108 (90 Base) MCG/ACT inhaler Inhale 1-2 puffs into the lungs every 6 (six) hours as needed for wheezing or shortness of breath. Use with spacer 11/19/17   Lutricia Feil, PA-C  gabapentin (NEURONTIN) 100 MG capsule Take 100 mg by mouth at bedtime.    [provider]  Multiple Vitamin (MULTI-VITAMIN) tablet Take 1 tablet by mouth daily.    [provider]  nirmatrelvir/ritonavir, renal dosing, (PAXLOVID) 10 x 150 MG & 10 x 100MG  TABS Take 2 tablets by mouth 2 (two) times daily for 5 days. Patient GFR is 37 Take nirmatrelvir (150 mg) one tablet twice daily for 5 days and ritonavir (100 mg) one tablet twice daily for 5 days. 06/26/23 07/01/23  Shirlee Latch, PA-C    Family History Family History  Problem Relation Age of Onset   Diabetes Paternal Uncle    COPD Mother    Heart attack Father     Social History Social History   Tobacco Use   Smoking status: Never   Smokeless tobacco: Never  Vaping Use   Vaping status: Never Used  Substance Use Topics   Alcohol use: No  Alcohol/week: 0.0 standard drinks of alcohol   Drug use: No     Allergies   Rosuvastatin and Lisinopril   Review of Systems Review of Systems  Constitutional:  Positive for fatigue and fever. Negative for chills and diaphoresis.  HENT:  Positive for congestion, rhinorrhea and sore throat. Negative for ear pain, sinus pressure and sinus pain.   Respiratory:  Positive for cough. Negative for shortness of breath.   Cardiovascular:  Negative for chest pain.  Gastrointestinal:  Negative for abdominal pain, nausea and vomiting.  Musculoskeletal:  Negative for arthralgias and myalgias.  Skin:  Negative for rash.   Neurological:  Positive for headaches. Negative for weakness.  Hematological:  Negative for adenopathy.     Physical Exam Triage Vital Signs ED Triage Vitals  Encounter Vitals Group     BP      Systolic BP Percentile      Diastolic BP Percentile      Pulse      Resp      Temp      Temp src      SpO2      Weight      Height      Head Circumference      Peak Flow      Pain Score      Pain Loc      Pain Education      Exclude from Growth Chart    No data found.  Updated Vital Signs BP (!) 179/73 (BP Location: Right Arm) Comment: Patient has not taken her BP medicine today  Pulse 80   Temp 100 F (37.8 C) (Oral)   Resp 14   Ht 5\' 1"  (1.549 m)   Wt 155 lb (70.3 kg)   SpO2 95%   BMI 29.29 kg/m      Physical Exam Vitals and nursing note reviewed.  Constitutional:      General: She is not in acute distress.    Appearance: Normal appearance. She is ill-appearing. She is not toxic-appearing.  HENT:     Head: Normocephalic and atraumatic.     Right Ear: Tympanic membrane, ear canal and external ear normal.     Left Ear: Tympanic membrane, ear canal and external ear normal.     Nose: Congestion present.     Mouth/Throat:     Mouth: Mucous membranes are moist.     Pharynx: Oropharynx is clear. Posterior oropharyngeal erythema present.  Eyes:     General: No scleral icterus.       Right eye: No discharge.        Left eye: No discharge.     Conjunctiva/sclera: Conjunctivae normal.  Cardiovascular:     Rate and Rhythm: Normal rate and regular rhythm.     Heart sounds: Normal heart sounds.  Pulmonary:     Effort: Pulmonary effort is normal. No respiratory distress.     Breath sounds: Normal breath sounds.  Musculoskeletal:     Cervical back: Neck supple.  Skin:    General: Skin is dry.  Neurological:     General: No focal deficit present.     Mental Status: She is alert. Mental status is at baseline.     Motor: No weakness.     Gait: Gait normal.   Psychiatric:        Mood and Affect: Mood normal.        Behavior: Behavior normal.        Thought Content: Thought content normal.  UC Treatments / Results  Labs (all labs ordered are listed, but only abnormal results are displayed) Labs Reviewed  SARS CORONAVIRUS 2 BY RT PCR - Abnormal; Notable for the following components:      Result Value   SARS Coronavirus 2 by RT PCR POSITIVE (*)    All other components within normal limits  GROUP A STREP BY PCR    EKG   Radiology No results found.  Procedures Procedures (including critical care time)  Medications Ordered in UC Medications - No data to display  Initial Impression / Assessment and Plan / UC Course  I have reviewed the triage vital signs and the nursing notes.  Pertinent labs & imaging results that were available during my care of the patient were reviewed by me and considered in my medical decision making (see chart for details).   72 year old female presents for fever, fatigue, chills, cough congestion, sore throat that began yesterday.  Has been exposed to COVID.  BP 179/73.  Has not taken BP meds due to painful throat.  Other vitals stable.  Temp 100 degrees.  She is in no acute distress.  Ill-appearing but nontoxic.  On exam is nasal congestion with clear drainage and erythema posterior pharynx.  Chest clear to auscultation.  COVID and strep testing obtained. +COVID. Negative strep.  Reviewed results patient.  Reviewed current CDC guidelines, isolation protocol and ED precautions.  Patient is interested in Paxlovid.  Most recent GFR performed last month shows result of 37.  Prescribed renal dosing Paxlovid, benzonatate and viscous lidocaine.  Advised to hold statin x 10 days.  Reviewed increasing rest and fluids and supportive care.  Reviewed return precautions.   Final Clinical Impressions(s) / UC Diagnoses   Final diagnoses:  COVID-19  Acute cough  Sore throat     Discharge Instructions       -COVID-positive.  You need to isolate until you are fever free and your symptoms are improving. This may be about 5 to 7 days. -I sent Paxlovid to pharmacy.  Do not take your cholesterol medication/statin for 10 days from today.  Also sent cough medication and lidocaine. - Take your blood pressure medication when you get home. - If you start to have any uncontrollable fever, weakness or breathing difficulty please return for reevaluation or go to ER.     ED Prescriptions     Medication Sig Dispense Auth. Provider   benzonatate (TESSALON) 200 MG capsule Take 1 capsule (200 mg total) by mouth 3 (three) times daily as needed for cough. 30 capsule Eusebio Friendly B, PA-C   nirmatrelvir/ritonavir, renal dosing, (PAXLOVID) 10 x 150 MG & 10 x 100MG  TABS  (Status: Discontinued) Take 2 tablets by mouth 2 (two) times daily for 5 days. Patient GFR is 37 Take nirmatrelvir (150 mg) one tablet twice daily for 5 days and ritonavir (100 mg) one tablet twice daily for 5 days. 20 tablet Eusebio Friendly B, PA-C   lidocaine (XYLOCAINE) 2 % solution Use as directed 15 mLs in the mouth or throat every 3 (three) hours as needed for mouth pain (swish and spit). 100 mL Eusebio Friendly B, PA-C   nirmatrelvir/ritonavir, renal dosing, (PAXLOVID) 10 x 150 MG & 10 x 100MG  TABS Take 2 tablets by mouth 2 (two) times daily for 5 days. Patient GFR is 37 Take nirmatrelvir (150 mg) one tablet twice daily for 5 days and ritonavir (100 mg) one tablet twice daily for 5 days. 20 tablet Shirlee Latch, PA-C  PDMP not reviewed this encounter.   Shirlee Latch, PA-C 06/26/23 1032

## 2023-06-26 NOTE — Discharge Instructions (Addendum)
-  COVID-positive.  You need to isolate until you are fever free and your symptoms are improving. This may be about 5 to 7 days. -I sent Paxlovid to pharmacy.  Do not take your cholesterol medication/statin for 10 days from today.  Also sent cough medication and lidocaine. - Take your blood pressure medication when you get home. - If you start to have any uncontrollable fever, weakness or breathing difficulty please return for reevaluation or go to ER.

## 2023-06-26 NOTE — ED Triage Notes (Signed)
Patient c/o sore throat and cough, runny nose that started yesterday.  Patient denies fevers.

## 2024-07-12 ENCOUNTER — Ambulatory Visit: Admission: EM | Admit: 2024-07-12 | Discharge: 2024-07-12 | Discharge status: Against Medical Advice

## 2024-07-13 ENCOUNTER — Ambulatory Visit: Admission: EM | Admit: 2024-07-13 | Discharge: 2024-07-13 | Disposition: A | Discharge status: Home / Self Care

## 2024-07-13 VITALS — BP 113/64 | HR 76 | Temp 98.1°F | Resp 16 | Wt 151.0 lb

## 2024-07-13 DIAGNOSIS — G629 Polyneuropathy, unspecified: Secondary | ICD-10-CM | POA: Diagnosis not present

## 2024-07-13 DIAGNOSIS — Z8739 Personal history of other diseases of the musculoskeletal system and connective tissue: Secondary | ICD-10-CM | POA: Diagnosis not present

## 2024-07-13 DIAGNOSIS — M722 Plantar fascial fibromatosis: Secondary | ICD-10-CM | POA: Diagnosis not present

## 2024-07-13 DIAGNOSIS — M79671 Pain in right foot: Secondary | ICD-10-CM

## 2024-07-13 DIAGNOSIS — M79672 Pain in left foot: Secondary | ICD-10-CM

## 2024-07-13 MED ORDER — GABAPENTIN 100 MG PO CAPS: 100.0000 mg | ORAL_CAPSULE | Freq: Every evening | ORAL | 0 refills | Status: AC | PRN

## 2024-07-13 MED ORDER — PREDNISONE 10 MG PO TABS: ORAL_TABLET | ORAL | 0 refills | Status: AC

## 2024-07-13 NOTE — ED Provider Notes (Signed)
 [facility]    CSN: [id number] Arrival date & time: 07/13/24  1108      History   Chief Complaint Chief Complaint  Patient presents with   Foot Pain    HPI Veronica Mueller is a 73 y.o. female with history of type 2 diabetes, gout, hypertension, peripheral neuropathy, chronic kidney disease, and hyperlipidemia presents for bilateral foot pain x 1 week. No injury. Reports symptoms started after walking a lot on vacation. She reports swelling. Denies erythema, wounds, contusion, numbness. Patient has been icing the feet, taking Tylenol and applying Aspercreme. She is concerned her symptoms may be due to gout.   HPI  Past Medical History:  Diagnosis Date   Allergy    Diabetes mellitus without complication (HCC)    Gout     There are no active problems to display for this patient.   Past Surgical History:  Procedure Laterality Date   GALLBLADDER SURGERY      OB History   No obstetric history on file.      Home Medications    Prior to Admission medications   Medication Sig Start Date End Date Taking? Authorizing Provider  amLODipine (NORVASC) 5 MG tablet Take 5 mg by mouth. 03/28/24 03/28/25 Yes [provider]  gabapentin  (NEURONTIN ) 100 MG capsule Take 1 capsule (100 mg total) by mouth at bedtime as needed (neuropathy). 07/13/24  Yes Arvis Jolan NOVAK, PA-C  predniSONE  (DELTASONE ) 10 MG tablet Take 6 tabs p.o. on day 1 and decrease by 1 tablet daily until complete 07/13/24  Yes Arvis Jolan B, PA-C  albuterol  (PROVENTIL  HFA;VENTOLIN  HFA) 108 (90 Base) MCG/ACT inhaler Inhale 1-2 puffs into the lungs every 6 (six) hours as needed for wheezing or shortness of breath. Use with spacer 11/19/17   Lacinda Elsie SQUIBB, PA-C  benzonatate  (TESSALON ) 200 MG capsule Take 1 capsule (200 mg total) by mouth 3 (three) times daily as needed for cough. 06/26/23   Arvis Jolan B, PA-C  escitalopram (LEXAPRO) 10 MG tablet Take 1 tablet by mouth daily. 01/13/23 01/13/24   [provider]  gabapentin  (NEURONTIN ) 100 MG capsule Take 100 mg by mouth at bedtime.    [provider]  hydrochlorothiazide (HYDRODIURIL) 12.5 MG tablet Take 12.5 mg by mouth. 04/29/23 04/28/24  [provider]  JANUVIA 25 MG tablet Take 1 tablet by mouth daily. 06/15/23 06/14/24  [provider]  lidocaine  (XYLOCAINE ) 2 % solution Use as directed 15 mLs in the mouth or throat every 3 (three) hours as needed for mouth pain (swish and spit). 06/26/23   Arvis Jolan NOVAK, PA-C  losartan (COZAAR) 100 MG tablet Take 100 mg by mouth. 06/15/23 06/14/24  [provider]  metFORMIN (GLUCOPHAGE-XR) 500 MG 24 hr tablet Take 1 tablet by mouth 2 (two) times daily. 10/25/20 05/03/24  [provider]  Multiple Vitamin (MULTI-VITAMIN) tablet Take 1 tablet by mouth daily.    [provider]  pravastatin (PRAVACHOL) 10 MG tablet Take 1 tablet by mouth at bedtime. 01/13/23 01/13/24  [provider]    Family History Family History  Problem Relation Age of Onset   Diabetes Paternal Uncle    COPD Mother    Heart attack Father     Social History Social History   Tobacco Use   Smoking status: Never   Smokeless tobacco: Never  Vaping Use   Vaping status: Never Used  Substance Use Topics   Alcohol use: No    Alcohol/week: 0.0 standard drinks of alcohol  Drug use: No     Allergies   Rosuvastatin and Lisinopril   Review of Systems Review of Systems  Musculoskeletal:  Positive for arthralgias and joint swelling. Negative for gait problem.  Skin:  Negative for color change and wound.  Neurological:  Negative for weakness and numbness.     Physical Exam Triage Vital Signs ED Triage Vitals  Encounter Vitals Group     BP      Girls Systolic BP Percentile      Girls Diastolic BP Percentile      Boys Systolic BP Percentile      Boys Diastolic BP Percentile      Pulse      Resp      Temp      Temp src      SpO2      Weight       Height      Head Circumference      Peak Flow      Pain Score      Pain Loc      Pain Education      Exclude from Growth Chart    No data found.  Updated Vital Signs BP 113/64 (BP Location: Right Arm)   Pulse 76   Temp 98.1 F (36.7 C) (Oral)   Resp 16   Wt 151 lb (68.5 kg)   SpO2 94%   BMI 28.53 kg/m      Physical Exam Vitals and nursing note reviewed.  Constitutional:      General: She is not in acute distress.    Appearance: Normal appearance. She is not ill-appearing or toxic-appearing.  HENT:     Head: Normocephalic and atraumatic.  Eyes:     General: No scleral icterus.       Right eye: No discharge.        Left eye: No discharge.     Conjunctiva/sclera: Conjunctivae normal.  Cardiovascular:     Rate and Rhythm: Normal rate and regular rhythm.  Pulmonary:     Effort: Pulmonary effort is normal. No respiratory distress.  Musculoskeletal:     Cervical back: Neck supple.     Comments: RIGHT FOOT: Mild swelling dorsal midfoot. Generalized tenderness of dorsal midfoot. TTP plantar foot. No ankle tenderness. No erythema, increased warmth, wounds, contusions. Good pulses.   LEFT FOOT: Mild swelling dorsal midfoot. Mild swelling bilateral ankle. TTP dorsal midfoot. No ankle tenderness. No erythema, increased warmth, wounds, contusions. Good pulses.    Skin:    General: Skin is dry.  Neurological:     General: No focal deficit present.     Mental Status: She is alert. Mental status is at baseline.     Motor: No weakness.     Gait: Gait normal.  Psychiatric:        Mood and Affect: Mood normal.        Behavior: Behavior normal.      UC Treatments / Results  Labs (all labs ordered are listed, but only abnormal results are displayed) Labs Reviewed - No data to display  EKG   Radiology No results found.  Procedures Procedures (including critical care time)  Medications Ordered in UC Medications - No data to display  Initial Impression / Assessment  and Plan / UC Course  I have reviewed the triage vital signs and the nursing notes.  Pertinent labs & imaging results that were available during my care of the patient were reviewed by me and considered in my medical  decision making (see chart for details).   73 y/o female with history of type 2 diabetes, gout, hypertension, peripheral neuropathy, chronic kidney disease, and hyperlipidemia presents for atraumatic bilateral foot pain x 1 week. Patient recently went on vacation and reports a lot of walking. Taking Tylenol, Aspercreme and using ice.   On exam, she has mild swelling of bilateral dorsal feet with generalized tenderness bilateral dorsal midfoot. No swelling, erythema, wounds.   Presentation consistent with inflammatory and neuropathic pain. Doubt gout. Sent prednisone  taper since she has CKD. Gabapentin  100 mg PRN at bed time. Advised continuing tylenol, Aspercreme and ice as needed. Recommended PCP follow up.  Acute flare of chronic underlying condition.    Final Clinical Impressions(s) / UC Diagnoses   Final diagnoses:  Bilateral foot pain  History of gout  Peripheral polyneuropathy  Plantar fasciitis, right     Discharge Instructions      - Symptoms are not really consistent with gout.  You have inflammation and swelling of your feet likely related to overuse.  Your peripheral neuropathy is also likely coming into play. - Since you cannot take NSAIDs I did send the prednisone  taper to pharmacy.  Also sent gabapentin  for you to take at nighttime.  May need a higher dose and if so please follow-up with PCP. -Continue Tylenol, ice and topical creams as needed.     ED Prescriptions     Medication Sig Dispense Auth. Provider   predniSONE  (DELTASONE ) 10 MG tablet Take 6 tabs p.o. on day 1 and decrease by 1 tablet daily until complete 21 tablet Arvis Huxley B, PA-C   gabapentin  (NEURONTIN ) 100 MG capsule Take 1 capsule (100 mg total) by mouth at bedtime as needed  (neuropathy). 30 capsule Arvis Huxley NOVAK, PA-C      PDMP not reviewed this encounter.   Arvis Huxley NOVAK, PA-C 07/13/24 1201

## 2024-07-13 NOTE — ED Triage Notes (Signed)
 Pt presents with bilateral foot pain x 1 week. Pt denies any injury. Pt has taken tylenol for the pain.

## 2024-07-13 NOTE — Discharge Instructions (Addendum)
-   Symptoms are not really consistent with gout.  You have inflammation and swelling of your feet likely related to overuse.  Your peripheral neuropathy is also likely coming into play. - Since you cannot take NSAIDs I did send the prednisone  taper to pharmacy.  Also sent gabapentin  for you to take at nighttime.  May need a higher dose and if so please follow-up with PCP. -Continue Tylenol, ice and topical creams as needed.

## 2024-11-06 NOTE — Progress Notes (Deleted)
 [facility] [address] [place], [place] [address]  Pulmonary Sleep Medicine   Office Visit Note  Patient Name: Veronica Mueller DOB: 31-Mar-1951 MRN [id number]    Chief Complaint: Obstructive Sleep Apnea visit  Brief History:  Katricia is seen today for an initial consult for APAP@ 10-15 cmH2O. The patient has a 5 month history of sleep apnea. Patient is not using PAP nightly.  The patient feels *** after sleeping with PAP.  The patient reports *** from PAP use. Reported sleepiness is  *** and the Epworth Sleepiness Score is *** out of 24. The patient *** take naps. The patient complains of the following: ***  The compliance download shows  compliance with an average use time of *** hours. The AHI is ***  The patient *** of limb movements disrupting sleep. The patient continues to require PAP therapy in order to eliminate sleep apnea.   ROS  General: (-) fever, (-) chills, (-) night sweat Nose and Sinuses: (-) nasal stuffiness or itchiness, (-) postnasal drip, (-) nosebleeds, (-) sinus trouble. Mouth and Throat: (-) sore throat, (-) hoarseness. Neck: (-) swollen glands, (-) enlarged thyroid, (-) neck pain. Respiratory: *** cough, *** shortness of breath, *** wheezing. Neurologic: *** numbness, *** tingling. Psychiatric: *** anxiety, *** depression   Current Medication: Outpatient Encounter Medications as of 11/07/2024  Medication Sig   albuterol  (PROVENTIL  HFA;VENTOLIN  HFA) 108 (90 Base) MCG/ACT inhaler Inhale 1-2 puffs into the lungs every 6 (six) hours as needed for wheezing or shortness of breath. Use with spacer   amLODipine (NORVASC) 5 MG tablet Take 5 mg by mouth.   benzonatate  (TESSALON ) 200 MG capsule Take 1 capsule (200 mg total) by mouth 3 (three) times daily as needed for cough.   escitalopram (LEXAPRO) 10 MG tablet Take 1 tablet by mouth daily.   gabapentin  (NEURONTIN ) 100 MG capsule Take 100 mg by mouth at bedtime.   gabapentin  (NEURONTIN ) 100 MG  capsule Take 1 capsule (100 mg total) by mouth at bedtime as needed (neuropathy).   hydrochlorothiazide (HYDRODIURIL) 12.5 MG tablet Take 12.5 mg by mouth.   JANUVIA 25 MG tablet Take 1 tablet by mouth daily.   lidocaine  (XYLOCAINE ) 2 % solution Use as directed 15 mLs in the mouth or throat every 3 (three) hours as needed for mouth pain (swish and spit).   losartan (COZAAR) 100 MG tablet Take 100 mg by mouth.   metFORMIN (GLUCOPHAGE-XR) 500 MG 24 hr tablet Take 1 tablet by mouth 2 (two) times daily.   Multiple Vitamin (MULTI-VITAMIN) tablet Take 1 tablet by mouth daily.   pravastatin (PRAVACHOL) 10 MG tablet Take 1 tablet by mouth at bedtime.   predniSONE  (DELTASONE ) 10 MG tablet Take 6 tabs p.o. on day 1 and decrease by 1 tablet daily until complete   No facility-administered encounter medications on file as of 11/07/2024.    Surgical History: Past Surgical History:  Procedure Laterality Date   GALLBLADDER SURGERY      Medical History: Past Medical History:  Diagnosis Date   Allergy    Diabetes mellitus without complication (HCC)    Gout     Family History: Non contributory to the present illness  Social History: Social History   Socioeconomic History   Marital status: Married    Spouse name: Not on file   Number of children: Not on file   Years of education: Not on file   Highest education level: Not on file  Occupational History   Not on file  Tobacco Use   Smoking status: Never   Smokeless tobacco: Never  Vaping Use   Vaping status: Never Used  Substance and Sexual Activity   Alcohol use: No    Alcohol/week: 0.0 standard drinks of alcohol   Drug use: No   Sexual activity: Not on file  Other Topics Concern   Not on file  Social History Narrative   Not on file   Social Drivers of Health   Tobacco Use: Low Risk  (11/02/2024)   Received from [facility]   Patient History    Smoking Tobacco Use: Never    Smokeless Tobacco Use: Never     Passive Exposure: Not on file  Financial Resource Strain: Low Risk  (12/30/2023)   Received from [facility]   Overall Financial Resource Strain (CARDIA)    Difficulty of Paying Living Expenses: Not very hard  Food Insecurity: No Food Insecurity (12/30/2023)   Received from [facility]   Epic    Within the past 12 months, you worried that your food would run out before you got the money to buy more.: Never true    Within the past 12 months, the food you bought just didn't last and you didn't have money to get more.: Never true  Transportation Needs: No Transportation Needs (12/30/2023)   Received from [facility] - Transportation    In the past 12 months, has lack of transportation kept you from medical appointments or from getting medications?: No    Lack of Transportation (Non-Medical): No  Physical Activity: Not on file  Stress: Not on file  Social Connections: Not on file  Intimate Partner Violence: Not on file  Depression (EYV7-0): Not on file  Alcohol Screen: Not on file  Housing: Unknown (12/30/2023)   Received from [facility]   Epic    In the last 12 months, was there a time when you were not able to pay the mortgage or rent on time?: No    Number of Times Moved in the Last Year: Not on file    At any time in the past 12 months, were you homeless or living in a shelter (including now)?: No  Utilities: Not At Risk (12/30/2023)   Received from [facility] Utilities    Threatened with loss of utilities: No  Health Literacy: Not on file    Vital Signs: There were no vitals taken for this visit. There is no height or weight on file to calculate BMI.    Examination: General Appearance: The patient is well-developed, well-nourished, and in no distress. Neck Circumference: *** Skin: Gross inspection of skin unremarkable. Head: normocephalic, no gross deformities. Eyes: no gross  deformities noted. ENT: ears appear grossly normal Neurologic: Alert and oriented. No involuntary movements.  STOP BANG RISK ASSESSMENT S (snore) Have you been told that you snore?     YES/N   T (tired) Are you often tired, fatigued, or sleepy during the day?   YES/NO  O (obstruction) Do you stop breathing, choke, or gasp during sleep? YES/NO   P (pressure) Do you have or are you being treated for high blood pressure? YES/NO   B (BMI) Is your body index greater than 35 kg/m? NO   A (age) Are you 39 years old or older? YES   N (neck) Do you have a neck circumference greater than 16 inches?   YES/NO   G (  gender) Are you a female? NO   TOTAL STOP/BANG YES ANSWERS        A STOP-Bang score of 2 or less is considered low risk, and a score of 5 or more is high risk for having either moderate or severe OSA. For people who score 3 or 4, doctors may need to perform further assessment to determine how likely they are to have OSA.         EPWORTH SLEEPINESS SCALE:  Scale:  (0)= no chance of dozing; (1)= slight chance of dozing; (2)= moderate chance of dozing; (3)= high chance of dozing  Chance  Situtation    Sitting and reading: ***    Watching TV: ***    Sitting Inactive in public: ***    As a passenger in car: ***      Lying down to rest: ***    Sitting and talking: ***    Sitting quielty after lunch: ***    In a car, stopped in traffic: ***   TOTAL SCORE:   *** out of 24    SLEEP STUDIES:  HST (05/2024) AHI 33.3/hr, min SpO2 67% Titration (07/2024) APAP@ 10-16 cmH2O   CPAP COMPLIANCE DATA:  Date Range: ***  Average Daily Use: *** hours  Median Use: ***  Compliance for > 4 Hours: *** days  AHI: *** respiratory events per hour  Days Used: ***  Mask Leak: ***  95th Percentile Pressure: ***         LABS: No results found for this or any previous visit (from the past 2160 hours).  Radiology: No results found.  No results found.  No  results found.    Assessment and Plan: There are no active problems to display for this patient.     The patient *** tolerate PAP and reports *** benefit from PAP use. The patient was reminded how to *** and advised to ***. The patient was also counselled on ***. The compliance is ***. The AHI is ***.   ***  General Counseling: I have discussed the findings of the evaluation and examination with Veronica Mueller.  I have also discussed any further diagnostic evaluation thatmay be needed or ordered today. Veronica Mueller verbalizes understanding of the findings of todays visit. We also reviewed her medications today and discussed drug interactions and side effects including but not limited excessive drowsiness and altered mental states. We also discussed that there is always a risk not just to her but also people around her. she has been encouraged to call the office with any questions or concerns that should arise related to todays visit.  No orders of the defined types were placed in this encounter.       I have personally obtained a history, examined the patient, evaluated laboratory and imaging results, formulated the assessment and plan and placed orders.  Elfreda DELENA Bathe, MD [facility] Diplomate ABMS Pulmonary Critical Care Medicine and Sleep Medicine

## 2024-11-07 ENCOUNTER — Ambulatory Visit: Payer: No Typology Code available for payment source

## 2024-11-30 ENCOUNTER — Ambulatory Visit
Admission: EM | Admit: 2024-11-30 | Discharge: 2024-11-30 | Disposition: A | Discharge status: Home / Self Care | Attending: Family Medicine | Admitting: Family Medicine

## 2024-11-30 DIAGNOSIS — R112 Nausea with vomiting, unspecified: Secondary | ICD-10-CM

## 2024-11-30 DIAGNOSIS — J111 Influenza due to unidentified influenza virus with other respiratory manifestations: Secondary | ICD-10-CM | POA: Diagnosis not present

## 2024-11-30 LAB — POC COVID19/FLU A&B COMBO
Covid Antigen, POC: NEGATIVE
Influenza A Antigen, POC: NEGATIVE
Influenza B Antigen, POC: NEGATIVE

## 2024-11-30 MED ORDER — ONDANSETRON 4 MG PO TBDP: 4.0000 mg | ORAL_TABLET | Freq: Three times a day (TID) | ORAL | 0 refills | Status: AC | PRN

## 2024-11-30 NOTE — ED Triage Notes (Signed)
 Sx x 1 day  Diarrhea Headache Emesis

## 2024-11-30 NOTE — ED Provider Notes (Signed)
 " [facility]    CSN: [id number] Arrival date & time: 11/30/24  1741      History   Chief Complaint Chief Complaint  Patient presents with   Emesis    HPI Veronica Mueller is a 74 y.o. female.   HPI  History obtained from the patient. Veronica Mueller presents for vomiting, diarrhea, chills, cough that started last night. Vomiting has stopped.  Endorses decreased appetite and headache.  She is concerned that she has the flu.  Took tylenol and pepto bismol about 2 hours ago.  Her son has similar sx.  They were in the ED with her grand-daughter and thinks she may have picked up a bug there.      Past Medical History:  Diagnosis Date   Allergy    Diabetes mellitus without complication (HCC)    Gout     There are no active problems to display for this patient.   Past Surgical History:  Procedure Laterality Date   GALLBLADDER SURGERY      OB History   No obstetric history on file.      Home Medications    Prior to Admission medications  Medication Sig Start Date End Date Taking? Authorizing Provider  amLODipine (NORVASC) 5 MG tablet Take 5 mg by mouth. 03/28/24 03/28/25 Yes [provider]  gabapentin  (NEURONTIN ) 100 MG capsule Take 100 mg by mouth at bedtime.   Yes [provider]  gabapentin  (NEURONTIN ) 100 MG capsule Take 1 capsule (100 mg total) by mouth at bedtime as needed (neuropathy). 07/13/24  Yes Arvis Jolan NOVAK, PA-C  hydrochlorothiazide (HYDRODIURIL) 12.5 MG tablet Take 12.5 mg by mouth. 11/02/23  Yes [provider]  JANUVIA 50 MG tablet Take 50 mg by mouth daily.   Yes [provider]  losartan (COZAAR) 100 MG tablet Take 100 mg by mouth. 11/02/23  Yes [provider]  metFORMIN (GLUCOPHAGE) 500 MG tablet Take 500 mg by mouth daily with breakfast. 09/16/24 09/16/25 Yes [provider]  ondansetron  (ZOFRAN -ODT) 4 MG disintegrating tablet Take 1 tablet (4 mg total) by mouth every 8 (eight) hours as  needed. 11/30/24  Yes Chryl Holten, DO  albuterol  (PROVENTIL  HFA;VENTOLIN  HFA) 108 (90 Base) MCG/ACT inhaler Inhale 1-2 puffs into the lungs every 6 (six) hours as needed for wheezing or shortness of breath. Use with spacer 11/19/17   Lacinda Elsie SQUIBB, PA-C  benzonatate  (TESSALON ) 200 MG capsule Take 1 capsule (200 mg total) by mouth 3 (three) times daily as needed for cough. 06/26/23   Arvis Jolan B, PA-C  escitalopram (LEXAPRO) 10 MG tablet Take 1 tablet by mouth daily. 01/13/23 01/13/24  [provider]  hydrochlorothiazide (HYDRODIURIL) 12.5 MG tablet Take 12.5 mg by mouth. 04/29/23 04/28/24  [provider]  JANUVIA 25 MG tablet Take 1 tablet by mouth daily. 06/15/23 06/14/24  [provider]  lidocaine  (XYLOCAINE ) 2 % solution Use as directed 15 mLs in the mouth or throat every 3 (three) hours as needed for mouth pain (swish and spit). 06/26/23   Arvis Jolan NOVAK, PA-C  losartan (COZAAR) 100 MG tablet Take 100 mg by mouth. 06/15/23 06/14/24  [provider]  metFORMIN (GLUCOPHAGE-XR) 500 MG 24 hr tablet Take 1 tablet by mouth 2 (two) times daily. 10/25/20 05/03/24  [provider]  Multiple Vitamin (MULTI-VITAMIN) tablet Take 1 tablet by mouth daily.    [provider]  pravastatin (PRAVACHOL) 10 MG tablet Take 1 tablet by mouth at bedtime. 01/13/23 01/13/24  [provider]  predniSONE  (DELTASONE ) 10 MG tablet Take 6 tabs p.o. on day 1 and decrease by 1 tablet daily until complete 07/13/24   Arvis Jolan NOVAK, PA-C    Family History Family History  Problem Relation Age of Onset   Diabetes Paternal Uncle    COPD Mother    Heart attack Father     Social History Social History[1]   Allergies   Rosuvastatin and Lisinopril   Review of Systems Review of Systems: negative unless otherwise stated in HPI.      Physical Exam Triage Vital Signs ED Triage Vitals  Encounter Vitals Group     BP 11/30/24 1822 111/70     Girls Systolic BP  Percentile --      Girls Diastolic BP Percentile --      Boys Systolic BP Percentile --      Boys Diastolic BP Percentile --      Pulse Rate 11/30/24 1822 64     Resp 11/30/24 1822 16     Temp 11/30/24 1822 98.8 F (37.1 C)     Temp Source 11/30/24 1822 Oral     SpO2 11/30/24 1822 96 %     Weight 11/30/24 1821 140 lb (63.5 kg)     Height --      Head Circumference --      Peak Flow --      Pain Score 11/30/24 1821 0     Pain Loc --      Pain Education --      Exclude from Growth Chart --    No data found.  Updated Vital Signs BP 111/70 (BP Location: Right Arm)   Pulse 64   Temp 98.8 F (37.1 C) (Oral)   Resp 16   Wt 63.5 kg   SpO2 96%   BMI 26.45 kg/m   Visual Acuity Right Eye Distance:   Left Eye Distance:   Bilateral Distance:    Right Eye Near:   Left Eye Near:    Bilateral Near:     Physical Exam GEN:     alert, non-toxic appearing female in no distress    HENT:  mucus membranes moist, oropharyngeal without lesions or erythema, no tonsillar hypertrophy or exudates,  no nasal discharge EYES:   no scleral injection or discharge NECK:  normal ROM, no lymphadenopathy, no meningismus   RESP:  no increased work of breathing, clear to auscultation bilaterally CVS:   regular rate and rhythm ABD:   Soft, nontender, nondistended, no guarding, no rebound, active bowel sounds throughout, negative McBurney's, negative Murphy's Skin:   warm and dry, no jaundice     UC Treatments / Results  Labs (all labs ordered are listed, but only abnormal results are displayed) Labs Reviewed  POC COVID19/FLU A&B COMBO - Normal    EKG   Radiology No results found.   Procedures Procedures (including critical care time)  Medications Ordered in UC Medications - No data to display  Initial Impression / Assessment and Plan / UC Course  I have reviewed the triage vital signs and the nursing notes.  Pertinent labs & imaging results that were available during my care of the  patient were reviewed by me and considered in my medical decision making (see chart for details).       Pt is a 74 y.o. female who presents for 1 day of GI and respiratory symptoms. [name] is afebrile here without recent antipyretics. Satting well on room air. Overall pt is non-toxic appearing,  well hydrated, without respiratory distress. Pulmonary exam is unremarkable.  POC COVID and influenza panel obtained and was negative.   Suspect viral respiratory illness. Discussed symptomatic treatment. Zofran  prescribed.  Typical duration of symptoms discussed.   Return and ED precautions given and voiced understanding. Discussed MDM, treatment plan and plan for follow-up with patient who agrees with plan.     Final Clinical Impressions(s) / UC Diagnoses   Final diagnoses:  Influenza-like illness  Nausea and vomiting, unspecified vomiting type     Discharge Instructions      Your influenza and COVID are negative.  You most likely have a viral respiratory infection that will gradually improve over the next 7-10 days. Cough may last up to 3 weeks. If your were prescribed medication, stop by the pharmacy to pick them up.   You can take Tylenol and/or Ibuprofen as needed for fever reduction and pain relief.    For cough: honey 1/2 to 1 teaspoon (you can dilute the honey in water or another fluid).  You can also use guaifenesin and dextromethorphan for cough.  You can use a humidifier for chest congestion and cough.  If you don't have a humidifier, you can sit in the bathroom with the hot shower running.      For sore throat: try warm salt water gargles, Mucinex sore throat cough drops or cepacol lozenges, throat spray, warm tea or water with lemon/honey, popsicles or ice, or OTC cold relief medicine for throat discomfort. You can also purchase chloraseptic spray at the pharmacy or dollar store.    For congestion: take a daily anti-histamine like Zyrtec, Claritin, and a oral decongestant, such  as pseudoephedrine.     It is important to stay hydrated: drink plenty of fluids (water, gatorade/powerade/pedialyte, juices, or teas) to keep your throat moisturized and help further relieve irritation/discomfort.    Return or go to the Emergency Department if symptoms worsen or do not improve in the next few days      ED Prescriptions     Medication Sig Dispense Auth. Provider   ondansetron  (ZOFRAN -ODT) 4 MG disintegrating tablet Take 1 tablet (4 mg total) by mouth every 8 (eight) hours as needed. 20 tablet Mirren Gest, DO      PDMP not reviewed this encounter.     [1]  Social History Tobacco Use   Smoking status: Never   Smokeless tobacco: Never  Vaping Use   Vaping status: Never Used  Substance Use Topics   Alcohol use: No    Alcohol/week: 0.0 standard drinks of alcohol   Drug use: No     Tahja Liao, DO 11/30/24 2002  "

## 2024-11-30 NOTE — Discharge Instructions (Signed)
 Your influenza and COVID are negative.  You most likely have a viral respiratory infection that will gradually improve over the next 7-10 days. Cough may last up to 3 weeks. If your were prescribed medication, stop by the pharmacy to pick them up.   You can take Tylenol and/or Ibuprofen as needed for fever reduction and pain relief.    For cough: honey 1/2 to 1 teaspoon (you can dilute the honey in water or another fluid).  You can also use guaifenesin and dextromethorphan for cough.  You can use a humidifier for chest congestion and cough.  If you don't have a humidifier, you can sit in the bathroom with the hot shower running.      For sore throat: try warm salt water gargles, Mucinex sore throat cough drops or cepacol lozenges, throat spray, warm tea or water with lemon/honey, popsicles or ice, or OTC cold relief medicine for throat discomfort. You can also purchase chloraseptic spray at the pharmacy or dollar store.    For congestion: take a daily anti-histamine like Zyrtec, Claritin, and a oral decongestant, such as pseudoephedrine.     It is important to stay hydrated: drink plenty of fluids (water, gatorade/powerade/pedialyte, juices, or teas) to keep your throat moisturized and help further relieve irritation/discomfort.    Return or go to the Emergency Department if symptoms worsen or do not improve in the next few days
